# Patient Record
Sex: Male | Born: 1970 | Race: White | Hispanic: No | Marital: Married | Smoking: Former smoker
Health system: Southern US, Community
[De-identification: ages and names within clinical notes are randomized; demographics above are authoritative.]

## PROBLEM LIST (undated history)

## (undated) DIAGNOSIS — N200 Calculus of kidney: Secondary | ICD-10-CM

## (undated) HISTORY — PX: HEMORRHOID SURGERY: SHX153

## (undated) HISTORY — PX: NASAL SEPTUM SURGERY: SHX37

## (undated) HISTORY — PX: SHOULDER ARTHROSCOPY: SHX128

---

## 2012-03-12 ENCOUNTER — Encounter (HOSPITAL_COMMUNITY): Payer: Self-pay | Admitting: *Deleted

## 2012-03-12 ENCOUNTER — Emergency Department (HOSPITAL_COMMUNITY)
Admission: EM | Admit: 2012-03-12 | Discharge: 2012-03-13 | Disposition: A | Discharge status: Home / Self Care | Payer: BC Managed Care – PPO | Attending: Emergency Medicine | Admitting: Emergency Medicine

## 2012-03-12 DIAGNOSIS — N509 Disorder of male genital organs, unspecified: Secondary | ICD-10-CM | POA: Insufficient documentation

## 2012-03-12 DIAGNOSIS — R319 Hematuria, unspecified: Secondary | ICD-10-CM | POA: Insufficient documentation

## 2012-03-12 DIAGNOSIS — N200 Calculus of kidney: Secondary | ICD-10-CM | POA: Insufficient documentation

## 2012-03-12 DIAGNOSIS — F17200 Nicotine dependence, unspecified, uncomplicated: Secondary | ICD-10-CM | POA: Insufficient documentation

## 2012-03-12 DIAGNOSIS — R350 Frequency of micturition: Secondary | ICD-10-CM | POA: Insufficient documentation

## 2012-03-12 DIAGNOSIS — R109 Unspecified abdominal pain: Secondary | ICD-10-CM | POA: Insufficient documentation

## 2012-03-12 DIAGNOSIS — M549 Dorsalgia, unspecified: Secondary | ICD-10-CM | POA: Insufficient documentation

## 2012-03-12 NOTE — ED Notes (Signed)
Pt reports onset of left sided flank pain approx 3 hs ago, reports asso hematuria

## 2012-03-12 NOTE — ED Provider Notes (Signed)
History     CSN: [id number]  Arrival date & time 03/12/12  2158   First MD Initiated Contact with Patient 03/12/12 2351      Chief Complaint  Patient presents with  . Flank Pain    (Consider location/radiation/quality/duration/timing/severity/associated sxs/prior treatment) HPI Comments: Acute onset of left-sided flank pain radiating to the (around 8 PM. Pain lasted for about one hour and resolved after patient urinated. He describes dark and painful urination. He still has some residual pain in his left groin and testicle. Denies any nausea, vomiting or fever. No further abdominal pain or flank pain. No history of kidney stones. No chest pain or shortness of breath.  The history is provided by the spouse and the patient.    History reviewed. No pertinent past medical history.  Past Surgical History  Procedure Date  . Shoulder arthroscopy   . Hemorrhoid surgery     No family history on file.  History  Substance Use Topics  . Smoking status: Current Some Day Smoker  . Smokeless tobacco: Not on file  . Alcohol Use: Yes      Review of Systems  Constitutional: Negative for fever, activity change and appetite change.  HENT: Negative for congestion and rhinorrhea.   Respiratory: Negative for cough, chest tightness and shortness of breath.   Cardiovascular: Negative for chest pain.  Gastrointestinal: Negative for nausea, vomiting and abdominal pain.  Genitourinary: Positive for frequency, hematuria, flank pain, difficulty urinating and testicular pain.  Musculoskeletal: Positive for back pain.  Skin: Negative for rash.  Neurological: Negative for weakness and headaches.    Allergies  Review of patient's allergies indicates no known allergies.  Home Medications   Current Outpatient Rx  Name Route Sig Dispense Refill  . ZOLOFT PO Oral Take by mouth.    Marland Kitchen HYDROCODONE-ACETAMINOPHEN 5-325 MG PO TABS Oral Take 2 tablets by mouth every 4 (four) hours as needed for pain.  10 tablet 0  . IBUPROFEN 800 MG PO TABS Oral Take 1 tablet (800 mg total) by mouth 3 (three) times daily. 21 tablet 0  . ONDANSETRON HCL 4 MG PO TABS Oral Take 1 tablet (4 mg total) by mouth every 6 (six) hours. 12 tablet 0    BP 149/74  Pulse 87  Temp(Src) 98 F (36.7 C) (Oral)  Resp 20  Ht 6' (1.829 m)  Wt 210 lb (95.255 kg)  BMI 28.48 kg/m2  SpO2 99%  Physical Exam  Constitutional: He is oriented to person, place, and time. He appears well-developed and well-nourished. No distress.  HENT:  Head: Normocephalic and atraumatic.  Mouth/Throat: Oropharynx is clear and moist. No oropharyngeal exudate.  Eyes: Conjunctivae are normal. Pupils are equal, round, and reactive to light.  Neck: Normal range of motion.  Cardiovascular: Normal rate, regular rhythm and normal heart sounds.   No murmur heard. Pulmonary/Chest: Breath sounds normal. No respiratory distress.  Abdominal: Soft. There is no tenderness. There is no rebound and no guarding.  Genitourinary:       No testicular tenderness  Musculoskeletal: Normal range of motion. He exhibits no edema and no tenderness.       No CVAT  Neurological: He is alert and oriented to person, place, and time. No cranial nerve deficit.  Skin: Skin is warm.    ED Course  Procedures (including critical care time)  Labs Reviewed  URINALYSIS, ROUTINE W REFLEX MICROSCOPIC - Abnormal; Notable for the following:    Hgb urine dipstick LARGE (*)    Ketones, ur  TRACE (*)    All other components within normal limits  POCT I-STAT, CHEM 8 - Abnormal; Notable for the following:    Glucose, Bld 117 (*)    All other components within normal limits  URINE MICROSCOPIC-ADD ON - Abnormal; Notable for the following:    Bacteria, UA FEW (*)    All other components within normal limits   Ct Abdomen Pelvis Wo Contrast  03/13/2012  *RADIOLOGY REPORT*  Clinical Data: Left flank pain for 1 day.  CT ABDOMEN AND PELVIS WITHOUT CONTRAST  Technique:  Multidetector  CT imaging of the abdomen and pelvis was performed following the standard protocol without intravenous contrast.  Comparison: None.  Findings: The visualized portion of the liver, spleen, pancreas, and adrenal glands appear unremarkable in noncontrast CT appearance.  The gallbladder appears contracted.  Food material noted in the stomach.  A left kidney upper pole nonobstructive collecting system calcification is present.  There is also mild left hydronephrosis and borderline hydroureter extending to a 2 mm left distal ureteral calculus shown on image 82 of series 2.  No right renal calculi noted.  Urinary bladder appears unremarkable.  Scattered pelvic phleboliths are present.  Small retroperitoneal lymph nodes are not pathologically enlarged by size criteria.  No pathologic retroperitoneal or porta hepatis adenopathy is identified.  No pathologic pelvic adenopathy is identified.  The appendix appears abnormally distended, with frothy stool like density proximally near the cecum on images 58 through 64 of series 4, and with a denser appearance in the appendiceal tip on image 67 of series 4.  The appendix measures up to 1.3 cm in diameter.  IMPRESSION:  1.  2 mm left distal ureteral calculus associated with minimal left hydronephrosis and borderline left hydroureter. 2.  There is also a nonobstructive left kidney upper pole calculus. 3.  Abnormal appearance of the appendix, which appears distended and which has a dense distal appearance.  I cannot confidently exclude entities such as appendiceal carcinoid or mucinous tumor, and non-urgent resection of the appendix may be indicated.  Original Report Authenticated By: Dellia Cloud, M.D.     1. Nephrolithiasis       MDM  Flank pain with urinary symptoms.  Now resolved.  Abdomen soft and benign.  UA, PO fluids, istat  Hematuria without infection. Small distal left ureteral calculus seen. Patient remains pain-free.  He is informed of his abnormal  appearing appendix and given surgery followup with Dr. Leticia Penna. No RLQ pain.  Followup with urology as needed and return to the ED for worsening symptoms.      Glynn Octave, MD 03/13/12 [phone]

## 2012-03-13 ENCOUNTER — Emergency Department (HOSPITAL_COMMUNITY): Payer: BC Managed Care – PPO

## 2012-03-13 LAB — POCT I-STAT, CHEM 8
BUN: 8 mg/dL (ref 6–23)
Calcium, Ion: 1.17 mmol/L (ref 1.12–1.32)
Chloride: 110 mEq/L (ref 96–112)
Creatinine, Ser: 0.8 mg/dL (ref 0.50–1.35)
Glucose, Bld: 117 mg/dL — ABNORMAL HIGH (ref 70–99)
HCT: 50 % (ref 39.0–52.0)
Hemoglobin: 17 g/dL (ref 13.0–17.0)
Potassium: 4.2 mEq/L (ref 3.5–5.1)
Sodium: 144 mEq/L (ref 135–145)
TCO2: 23 mmol/L (ref 0–100)

## 2012-03-13 LAB — URINALYSIS, ROUTINE W REFLEX MICROSCOPIC
Glucose, UA: NEGATIVE mg/dL
Protein, ur: NEGATIVE mg/dL

## 2012-03-13 LAB — URINE MICROSCOPIC-ADD ON

## 2012-03-13 MED ORDER — ONDANSETRON HCL 4 MG PO TABS: 4.0000 mg | ORAL_TABLET | Freq: Four times a day (QID) | ORAL | Status: AC

## 2012-03-13 MED ORDER — IBUPROFEN 800 MG PO TABS: 800.0000 mg | ORAL_TABLET | Freq: Three times a day (TID) | ORAL | Status: AC

## 2012-03-13 MED ORDER — HYDROCODONE-ACETAMINOPHEN 5-325 MG PO TABS: 2.0000 | ORAL_TABLET | ORAL | Status: AC | PRN

## 2012-03-13 NOTE — Discharge Instructions (Signed)
Kidney Stones You have a small kidney stone. You should be able to pass this on her own. Take pain and nausea medications as prescribed. Follow up with Dr. Caesar Bookman regarding your abnormal appendix.  Return to the ED if you develop new or worsening symptoms. Kidney stones (ureteral lithiasis) are deposits that form inside your kidneys. The intense pain is caused by the stone moving through the urinary tract. When the stone moves, the ureter goes into spasm around the stone. The stone is usually passed in the urine.  CAUSES   A disorder that makes certain neck glands produce too much parathyroid hormone (primary hyperparathyroidism).   A buildup of uric acid crystals.   Narrowing (stricture) of the ureter.   A kidney obstruction present at birth (congenital obstruction).   Previous surgery on the kidney or ureters.   Numerous kidney infections.  SYMPTOMS   Feeling sick to your stomach (nauseous).   Throwing up (vomiting).   Blood in the urine (hematuria).   Pain that usually spreads (radiates) to the groin.   Frequency or urgency of urination.  DIAGNOSIS   Taking a history and physical exam.   Blood or urine tests.   Computerized X-ray scan (CT scan).   Occasionally, an examination of the inside of the urinary bladder (cystoscopy) is performed.  TREATMENT   Observation.   Increasing your fluid intake.   Surgery may be needed if you have severe pain or persistent obstruction.  The size, location, and chemical composition are all important variables that will determine the proper choice of action for you. Talk to your caregiver to better understand your situation so that you will minimize the risk of injury to yourself and your kidney.  HOME CARE INSTRUCTIONS   Drink enough water and fluids to keep your urine clear or pale yellow.   Strain all urine through the provided strainer. Keep all particulate matter and stones for your caregiver to see. The stone causing the pain  may be as small as a grain of salt. It is very important to use the strainer each and every time you pass your urine. The collection of your stone will allow your caregiver to analyze it and verify that a stone has actually passed.   Only take over-the-counter or prescription medicines for pain, discomfort, or fever as directed by your caregiver.   Make a follow-up appointment with your caregiver as directed.   Get follow-up X-rays if required. The absence of pain does not always mean that the stone has passed. It may have only stopped moving. If the urine remains completely obstructed, it can cause loss of kidney function or even complete destruction of the kidney. It is your responsibility to make sure X-rays and follow-ups are completed. Ultrasounds of the kidney can show blockages and the status of the kidney. Ultrasounds are not associated with any radiation and can be performed easily in a matter of minutes.  SEEK IMMEDIATE MEDICAL CARE IF:   Pain cannot be controlled with the prescribed medicine.   You have a fever.   The severity or intensity of pain increases over 18 hours and is not relieved by pain medicine.   You develop a new onset of abdominal pain.   You feel faint or pass out.  MAKE SURE YOU:   Understand these instructions.   Will watch your condition.   Will get help right away if you are not doing well or get worse.  Document Released: 10/18/2005 Document Revised: 10/07/2011 Document Reviewed: 02/13/2010  ExitCare Patient Information 2012 [place].

## 2015-02-04 ENCOUNTER — Inpatient Hospital Stay (HOSPITAL_COMMUNITY): Payer: BLUE CROSS/BLUE SHIELD | Admitting: Anesthesiology

## 2015-02-04 ENCOUNTER — Inpatient Hospital Stay (HOSPITAL_COMMUNITY)
Admission: EM | Admit: 2015-02-04 | Discharge: 2015-02-05 | DRG: 343 | Disposition: A | Discharge status: Home / Self Care | Payer: BLUE CROSS/BLUE SHIELD | Attending: General Surgery | Admitting: General Surgery

## 2015-02-04 ENCOUNTER — Emergency Department (HOSPITAL_COMMUNITY): Payer: BLUE CROSS/BLUE SHIELD

## 2015-02-04 ENCOUNTER — Encounter (HOSPITAL_COMMUNITY): Payer: Self-pay | Admitting: *Deleted

## 2015-02-04 ENCOUNTER — Encounter (HOSPITAL_COMMUNITY)
Admission: EM | Disposition: A | Discharge status: Home / Self Care | Payer: Self-pay | Source: Home / Self Care | Attending: General Surgery

## 2015-02-04 DIAGNOSIS — Z87891 Personal history of nicotine dependence: Secondary | ICD-10-CM

## 2015-02-04 DIAGNOSIS — K37 Unspecified appendicitis: Secondary | ICD-10-CM | POA: Diagnosis present

## 2015-02-04 DIAGNOSIS — Z87442 Personal history of urinary calculi: Secondary | ICD-10-CM | POA: Diagnosis not present

## 2015-02-04 DIAGNOSIS — Z7982 Long term (current) use of aspirin: Secondary | ICD-10-CM

## 2015-02-04 DIAGNOSIS — R1031 Right lower quadrant pain: Secondary | ICD-10-CM | POA: Diagnosis present

## 2015-02-04 DIAGNOSIS — K3580 Unspecified acute appendicitis: Secondary | ICD-10-CM | POA: Diagnosis present

## 2015-02-04 HISTORY — DX: Calculus of kidney: N20.0

## 2015-02-04 HISTORY — PX: LAPAROSCOPIC APPENDECTOMY: SHX408

## 2015-02-04 LAB — URINALYSIS, ROUTINE W REFLEX MICROSCOPIC
BILIRUBIN URINE: NEGATIVE
GLUCOSE, UA: NEGATIVE mg/dL
HGB URINE DIPSTICK: NEGATIVE
KETONES UR: NEGATIVE mg/dL
Leukocytes, UA: NEGATIVE
Nitrite: NEGATIVE
PROTEIN: NEGATIVE mg/dL
Specific Gravity, Urine: 1.02 (ref 1.005–1.030)
Urobilinogen, UA: 0.2 mg/dL (ref 0.0–1.0)
pH: 8 (ref 5.0–8.0)

## 2015-02-04 LAB — CBC WITH DIFFERENTIAL/PLATELET
BASOS ABS: 0 10*3/uL (ref 0.0–0.1)
BASOS PCT: 0 % (ref 0–1)
EOS ABS: 0 10*3/uL (ref 0.0–0.7)
EOS PCT: 0 % (ref 0–5)
HCT: 44.6 % (ref 39.0–52.0)
Hemoglobin: 15.4 g/dL (ref 13.0–17.0)
Lymphocytes Relative: 8 % — ABNORMAL LOW (ref 12–46)
Lymphs Abs: 0.9 10*3/uL (ref 0.7–4.0)
MCH: 30.6 pg (ref 26.0–34.0)
MCHC: 34.5 g/dL (ref 30.0–36.0)
MCV: 88.5 fL (ref 78.0–100.0)
MONO ABS: 1.2 10*3/uL — AB (ref 0.1–1.0)
Monocytes Relative: 11 % (ref 3–12)
Neutro Abs: 9.2 10*3/uL — ABNORMAL HIGH (ref 1.7–7.7)
Neutrophils Relative %: 81 % — ABNORMAL HIGH (ref 43–77)
PLATELETS: 290 10*3/uL (ref 150–400)
RBC: 5.04 MIL/uL (ref 4.22–5.81)
RDW: 12.7 % (ref 11.5–15.5)
WBC: 11.4 10*3/uL — AB (ref 4.0–10.5)

## 2015-02-04 LAB — SURGICAL PCR SCREEN
MRSA, PCR: NEGATIVE
STAPHYLOCOCCUS AUREUS: NEGATIVE

## 2015-02-04 LAB — COMPREHENSIVE METABOLIC PANEL
ALK PHOS: 110 U/L (ref 39–117)
ALT: 57 U/L — AB (ref 0–53)
AST: 38 U/L — AB (ref 0–37)
Albumin: 3.8 g/dL (ref 3.5–5.2)
Anion gap: 7 (ref 5–15)
BUN: 9 mg/dL (ref 6–23)
CO2: 24 mmol/L (ref 19–32)
Calcium: 8.7 mg/dL (ref 8.4–10.5)
Chloride: 107 mmol/L (ref 96–112)
Creatinine, Ser: 0.63 mg/dL (ref 0.50–1.35)
GLUCOSE: 103 mg/dL — AB (ref 70–99)
POTASSIUM: 3.6 mmol/L (ref 3.5–5.1)
Sodium: 138 mmol/L (ref 135–145)
Total Bilirubin: 0.6 mg/dL (ref 0.3–1.2)
Total Protein: 6.8 g/dL (ref 6.0–8.3)

## 2015-02-04 SURGERY — APPENDECTOMY, LAPAROSCOPIC
Anesthesia: General | Site: Abdomen

## 2015-02-04 MED ORDER — IOHEXOL 300 MG/ML  SOLN
50.0000 mL | Freq: Once | INTRAMUSCULAR | Status: AC | PRN
  Administered 2015-02-04: 50 mL via ORAL

## 2015-02-04 MED ORDER — GLYCOPYRROLATE 0.2 MG/ML IJ SOLN
INTRAMUSCULAR | Status: AC
  Filled 2015-02-04: qty 2

## 2015-02-04 MED ORDER — CHLORHEXIDINE GLUCONATE 4 % EX LIQD
1.0000 "application " | Freq: Once | CUTANEOUS | Status: DC
  Filled 2015-02-04: qty 15

## 2015-02-04 MED ORDER — IOHEXOL 300 MG/ML  SOLN
100.0000 mL | Freq: Once | INTRAMUSCULAR | Status: AC | PRN
  Administered 2015-02-04: 100 mL via INTRAVENOUS

## 2015-02-04 MED ORDER — POVIDONE-IODINE 10 % EX OINT
TOPICAL_OINTMENT | CUTANEOUS | Status: DC | PRN
  Administered 2015-02-04: 1 via TOPICAL

## 2015-02-04 MED ORDER — PROPOFOL 10 MG/ML IV BOLUS
INTRAVENOUS | Status: AC
  Filled 2015-02-04: qty 20

## 2015-02-04 MED ORDER — FENTANYL CITRATE 0.05 MG/ML IJ SOLN
INTRAMUSCULAR | Status: DC | PRN
  Administered 2015-02-04: 100 ug via INTRAVENOUS
  Administered 2015-02-04 (×3): 50 ug via INTRAVENOUS

## 2015-02-04 MED ORDER — HYDROMORPHONE HCL 1 MG/ML IJ SOLN
1.0000 mg | INTRAMUSCULAR | Status: DC | PRN
  Administered 2015-02-04 – 2015-02-05 (×3): 1 mg via INTRAVENOUS
  Filled 2015-02-04 (×4): qty 1

## 2015-02-04 MED ORDER — ONDANSETRON HCL 4 MG/2ML IJ SOLN: 4.0000 mg | Freq: Four times a day (QID) | INTRAMUSCULAR | Status: DC | PRN

## 2015-02-04 MED ORDER — PIPERACILLIN-TAZOBACTAM 3.375 G IVPB
3.3750 g | Freq: Three times a day (TID) | INTRAVENOUS | Status: DC
  Administered 2015-02-05 (×2): 3.375 g via INTRAVENOUS
  Filled 2015-02-04 (×5): qty 50

## 2015-02-04 MED ORDER — HYDROMORPHONE HCL 1 MG/ML IJ SOLN
1.0000 mg | INTRAMUSCULAR | Status: DC | PRN
  Administered 2015-02-04: 1 mg via INTRAVENOUS
  Filled 2015-02-04: qty 1

## 2015-02-04 MED ORDER — BUPIVACAINE HCL (PF) 0.5 % IJ SOLN
INTRAMUSCULAR | Status: AC
  Filled 2015-02-04: qty 30

## 2015-02-04 MED ORDER — 0.9 % SODIUM CHLORIDE (POUR BTL) OPTIME
TOPICAL | Status: DC | PRN
  Administered 2015-02-04: 1000 mL

## 2015-02-04 MED ORDER — LACTATED RINGERS IV SOLN
INTRAVENOUS | Status: DC
  Administered 2015-02-04 (×2): via INTRAVENOUS

## 2015-02-04 MED ORDER — KETOROLAC TROMETHAMINE 30 MG/ML IJ SOLN
30.0000 mg | Freq: Once | INTRAMUSCULAR | Status: AC
  Administered 2015-02-04: 30 mg via INTRAVENOUS

## 2015-02-04 MED ORDER — ONDANSETRON HCL 4 MG PO TABS: 4.0000 mg | ORAL_TABLET | Freq: Four times a day (QID) | ORAL | Status: DC | PRN

## 2015-02-04 MED ORDER — MIDAZOLAM HCL 2 MG/2ML IJ SOLN
INTRAMUSCULAR | Status: AC
  Filled 2015-02-04: qty 2

## 2015-02-04 MED ORDER — LACTATED RINGERS IV SOLN: INTRAVENOUS | Status: DC

## 2015-02-04 MED ORDER — KETOROLAC TROMETHAMINE 30 MG/ML IJ SOLN
INTRAMUSCULAR | Status: AC
  Filled 2015-02-04: qty 1

## 2015-02-04 MED ORDER — NEOSTIGMINE METHYLSULFATE 10 MG/10ML IV SOLN
INTRAVENOUS | Status: AC
  Filled 2015-02-04: qty 1

## 2015-02-04 MED ORDER — BUPIVACAINE HCL (PF) 0.5 % IJ SOLN
INTRAMUSCULAR | Status: DC | PRN
  Administered 2015-02-04: 10 mL

## 2015-02-04 MED ORDER — HYDROMORPHONE HCL 1 MG/ML IJ SOLN
1.0000 mg | Freq: Once | INTRAMUSCULAR | Status: AC
  Administered 2015-02-04: 1 mg via INTRAVENOUS
  Filled 2015-02-04: qty 1

## 2015-02-04 MED ORDER — ROCURONIUM BROMIDE 100 MG/10ML IV SOLN
INTRAVENOUS | Status: DC | PRN
  Administered 2015-02-04: 30 mg via INTRAVENOUS
  Administered 2015-02-04: 10 mg via INTRAVENOUS

## 2015-02-04 MED ORDER — ROCURONIUM BROMIDE 50 MG/5ML IV SOLN
INTRAVENOUS | Status: AC
  Filled 2015-02-04: qty 1

## 2015-02-04 MED ORDER — ONDANSETRON HCL 4 MG/2ML IJ SOLN
INTRAMUSCULAR | Status: AC
  Filled 2015-02-04: qty 2

## 2015-02-04 MED ORDER — LIDOCAINE HCL 1 % IJ SOLN
INTRAMUSCULAR | Status: DC | PRN
  Administered 2015-02-04: 30 mg via INTRADERMAL

## 2015-02-04 MED ORDER — GLYCOPYRROLATE 0.2 MG/ML IJ SOLN
INTRAMUSCULAR | Status: DC | PRN
  Administered 2015-02-04: 0.4 mg via INTRAVENOUS

## 2015-02-04 MED ORDER — MIDAZOLAM HCL 5 MG/5ML IJ SOLN
INTRAMUSCULAR | Status: DC | PRN
  Administered 2015-02-04 (×2): 2 mg via INTRAVENOUS

## 2015-02-04 MED ORDER — OXYCODONE-ACETAMINOPHEN 5-325 MG PO TABS
1.0000 | ORAL_TABLET | ORAL | Status: DC | PRN
  Administered 2015-02-05 (×2): 2 via ORAL
  Filled 2015-02-04 (×2): qty 2

## 2015-02-04 MED ORDER — NEOSTIGMINE METHYLSULFATE 10 MG/10ML IV SOLN
INTRAVENOUS | Status: DC | PRN
  Administered 2015-02-04: 2 mg via INTRAVENOUS
  Administered 2015-02-04: 1 mg via INTRAVENOUS

## 2015-02-04 MED ORDER — LORAZEPAM 2 MG/ML IJ SOLN
1.0000 mg | INTRAMUSCULAR | Status: DC | PRN
  Administered 2015-02-04: 1 mg via INTRAVENOUS
  Filled 2015-02-04: qty 1

## 2015-02-04 MED ORDER — ONDANSETRON HCL 4 MG/2ML IJ SOLN
INTRAMUSCULAR | Status: DC | PRN
Start: 2015-02-04 — End: 2015-02-04
  Administered 2015-02-04: 4 mg via INTRAVENOUS

## 2015-02-04 MED ORDER — ENOXAPARIN SODIUM 40 MG/0.4ML ~~LOC~~ SOLN: 40.0000 mg | SUBCUTANEOUS | Status: DC

## 2015-02-04 MED ORDER — PIPERACILLIN-TAZOBACTAM 3.375 G IVPB
3.3750 g | Freq: Three times a day (TID) | INTRAVENOUS | Status: DC
  Administered 2015-02-04: 3.375 g via INTRAVENOUS
  Filled 2015-02-04 (×4): qty 50

## 2015-02-04 MED ORDER — PROPOFOL 10 MG/ML IV BOLUS
INTRAVENOUS | Status: DC | PRN
  Administered 2015-02-04: 140 mg via INTRAVENOUS

## 2015-02-04 MED ORDER — FENTANYL CITRATE 0.05 MG/ML IJ SOLN
INTRAMUSCULAR | Status: AC
Start: 2015-02-04 — End: 2015-02-04
  Filled 2015-02-04: qty 5

## 2015-02-04 MED ORDER — POVIDONE-IODINE 10 % EX OINT
TOPICAL_OINTMENT | CUTANEOUS | Status: AC
  Filled 2015-02-04: qty 1

## 2015-02-04 MED ORDER — SODIUM CHLORIDE 0.9 % IV SOLN
3.0000 g | Freq: Once | INTRAVENOUS | Status: AC
  Administered 2015-02-04: 3 g via INTRAVENOUS
  Filled 2015-02-04: qty 3

## 2015-02-04 MED ORDER — ENOXAPARIN SODIUM 40 MG/0.4ML ~~LOC~~ SOLN
40.0000 mg | SUBCUTANEOUS | Status: DC
  Administered 2015-02-04: 40 mg via SUBCUTANEOUS
  Filled 2015-02-04: qty 0.4

## 2015-02-04 MED ORDER — LIDOCAINE HCL (PF) 1 % IJ SOLN
INTRAMUSCULAR | Status: AC
  Filled 2015-02-04: qty 5

## 2015-02-04 SURGICAL SUPPLY — 49 items
BAG HAMPER (MISCELLANEOUS) ×3 IMPLANT
CHLORAPREP W/TINT 26ML (MISCELLANEOUS) ×3 IMPLANT
CLOTH BEACON ORANGE TIMEOUT ST (SAFETY) ×3 IMPLANT
COVER LIGHT HANDLE STERIS (MISCELLANEOUS) ×6 IMPLANT
CUTTER FLEX LINEAR 45M (STAPLE) ×3 IMPLANT
CUTTER LINEAR ENDO 35 ETS (STAPLE) IMPLANT
CUTTER LINEAR ENDO 35 ETS TH (STAPLE) IMPLANT
DECANTER SPIKE VIAL GLASS SM (MISCELLANEOUS) ×3 IMPLANT
DISSECTOR BLUNT TIP ENDO 5MM (MISCELLANEOUS) IMPLANT
ELECT REM PT RETURN 9FT ADLT (ELECTROSURGICAL) ×3
ELECTRODE REM PT RTRN 9FT ADLT (ELECTROSURGICAL) ×1 IMPLANT
FILTER SMOKE EVAC LAPAROSHD (FILTER) ×3 IMPLANT
FORMALIN 10 PREFIL 120ML (MISCELLANEOUS) ×3 IMPLANT
GLOVE BIO SURGEON STRL SZ7 (GLOVE) ×6 IMPLANT
GLOVE BIOGEL PI IND STRL 7.0 (GLOVE) ×2 IMPLANT
GLOVE BIOGEL PI INDICATOR 7.0 (GLOVE) ×4
GLOVE SURG SS PI 7.5 STRL IVOR (GLOVE) ×3 IMPLANT
GOWN STRL REUS W/ TWL XL LVL3 (GOWN DISPOSABLE) ×2 IMPLANT
GOWN STRL REUS W/TWL LRG LVL3 (GOWN DISPOSABLE) ×3 IMPLANT
GOWN STRL REUS W/TWL XL LVL3 (GOWN DISPOSABLE) ×4
INST SET LAPROSCOPIC AP (KITS) ×3 IMPLANT
IV NS IRRIG 3000ML ARTHROMATIC (IV SOLUTION) IMPLANT
KIT ROOM TURNOVER APOR (KITS) ×3 IMPLANT
MANIFOLD NEPTUNE II (INSTRUMENTS) ×3 IMPLANT
NEEDLE INSUFFLATION 14GA 120MM (NEEDLE) ×3 IMPLANT
NS IRRIG 1000ML POUR BTL (IV SOLUTION) ×3 IMPLANT
PACK LAP CHOLE LZT030E (CUSTOM PROCEDURE TRAY) ×3 IMPLANT
PAD ARMBOARD 7.5X6 YLW CONV (MISCELLANEOUS) ×3 IMPLANT
PENCIL HANDSWITCHING (ELECTRODE) ×3 IMPLANT
POUCH SPECIMEN RETRIEVAL 10MM (ENDOMECHANICALS) ×3 IMPLANT
RELOAD /EVU35 (ENDOMECHANICALS) IMPLANT
RELOAD 45 VASCULAR/THIN (ENDOMECHANICALS) IMPLANT
RELOAD CUTTER ETS 35MM STAND (ENDOMECHANICALS) IMPLANT
RELOAD STAPLE TA45 3.5 REG BLU (ENDOMECHANICALS) ×3 IMPLANT
SCALPEL HARMONIC ACE (MISCELLANEOUS) ×3 IMPLANT
SET BASIN LINEN APH (SET/KITS/TRAYS/PACK) ×3 IMPLANT
SET TUBE IRRIG SUCTION NO TIP (IRRIGATION / IRRIGATOR) IMPLANT
SPONGE GAUZE 2X2 8PLY STER LF (GAUZE/BANDAGES/DRESSINGS) ×3
SPONGE GAUZE 2X2 8PLY STRL LF (GAUZE/BANDAGES/DRESSINGS) ×6 IMPLANT
STAPLER VISISTAT (STAPLE) ×3 IMPLANT
SUT VICRYL 0 UR6 27IN ABS (SUTURE) ×3 IMPLANT
TAPE CLOTH SURG 4X10 WHT LF (GAUZE/BANDAGES/DRESSINGS) ×3 IMPLANT
TRAY FOLEY CATH 16FR SILVER (SET/KITS/TRAYS/PACK) ×3 IMPLANT
TROCAR ENDO BLADELESS 11MM (ENDOMECHANICALS) ×3 IMPLANT
TROCAR ENDO BLADELESS 12MM (ENDOMECHANICALS) ×3 IMPLANT
TROCAR XCEL NON-BLD 5MMX100MML (ENDOMECHANICALS) ×3 IMPLANT
TUBING INSUFFLATION (TUBING) ×3 IMPLANT
WARMER LAPAROSCOPE (MISCELLANEOUS) ×3 IMPLANT
YANKAUER SUCT 12FT TUBE ARGYLE (SUCTIONS) ×3 IMPLANT

## 2015-02-04 NOTE — Anesthesia Preprocedure Evaluation (Addendum)
Anesthesia Evaluation  Patient identified by MRN, date of birth, ID band Patient awake    Reviewed: Allergy & Precautions, NPO status , Patient's Chart, lab work & pertinent test results  Airway   TM Distance: >3 FB Neck ROM: Full    Dental  (+) Teeth Intact   Pulmonary former smoker,  breath sounds clear to auscultation        Cardiovascular Exercise Tolerance: Good hypertension, Rhythm:Regular Rate:Normal     Neuro/Psych Anxiety Depression    GI/Hepatic   Endo/Other  negative endocrine ROS  Renal/GU      Musculoskeletal   Abdominal (+) + obese,  Abdomen: soft. Bowel sounds: absent.  Peds  Hematology   Anesthesia Other Findings   Reproductive/Obstetrics                           Anesthesia Physical Anesthesia Plan  ASA: II and emergent  Anesthesia Plan: General   Post-op Pain Management:    Induction: Intravenous, Rapid sequence and Cricoid pressure planned  Airway Management Planned: Oral ETT  Additional Equipment:   Intra-op Plan:   Post-operative Plan: Extubation in OR  Informed Consent: I have reviewed the patients History and Physical, chart, labs and discussed the procedure including the risks, benefits and alternatives for the proposed anesthesia with the patient or authorized representative who has indicated his/her understanding and acceptance.     Plan Discussed with: CRNA and Anesthesiologist  Anesthesia Plan Comments:         Anesthesia Quick Evaluation

## 2015-02-04 NOTE — ED Notes (Signed)
RLQ pain for 2 days,no n/v . Has had diarrhea.

## 2015-02-04 NOTE — H&P (Signed)
Jared Vincent is an 44 y.o. male.   Chief Complaint: Right lower quadrant abdominal pain HPI: Patient is a 44 year old white male who presented emergency room with a several-day history of worsening lower abdominal pain. He has a history of a kidney stone, though he did say that this was different than his previous kidney stone attack. CT scan of the abdomen and pelvis reveals acute appendicitis. No perforation was seen.  Past Medical History  Diagnosis Date  . Kidney stone     Past Surgical History  Procedure Laterality Date  . Shoulder arthroscopy    . Hemorrhoid surgery    . Nasal septum surgery      History reviewed. No pertinent family history. Social History:  reports that he has quit smoking. He does not have any smokeless tobacco history on file. He reports that he does not drink alcohol or use illicit drugs.  Allergies: No Known Allergies  Medications Prior to Admission  Medication Sig Dispense Refill  . aspirin EC 81 MG tablet Take 81 mg by mouth daily.    . montelukast (SINGULAIR) 10 MG tablet Take 10 mg by mouth at bedtime.    . Multiple Vitamin (MULTIVITAMIN WITH MINERALS) TABS tablet Take 1 tablet by mouth daily.    . Sertraline HCl (ZOLOFT PO) Take 100 mg by mouth daily.     Marland Kitchen zolpidem (AMBIEN) 10 MG tablet Take 10 mg by mouth at bedtime as needed for sleep.      Results for orders placed or performed during the hospital encounter of 02/04/15 (from the past 48 hour(s))  CBC with Differential     Status: Abnormal   Collection Time: 02/04/15 11:50 AM  Result Value Ref Range   WBC 11.4 (H) 4.0 - 10.5 K/uL   RBC 5.04 4.22 - 5.81 MIL/uL   Hemoglobin 15.4 13.0 - 17.0 g/dL   HCT 44.6 39.0 - 52.0 %   MCV 88.5 78.0 - 100.0 fL   MCH 30.6 26.0 - 34.0 pg   MCHC 34.5 30.0 - 36.0 g/dL   RDW 12.7 11.5 - 15.5 %   Platelets 290 150 - 400 K/uL   Neutrophils Relative % 81 (H) 43 - 77 %   Neutro Abs 9.2 (H) 1.7 - 7.7 K/uL   Lymphocytes Relative 8 (L) 12 - 46 %   Lymphs Abs  0.9 0.7 - 4.0 K/uL   Monocytes Relative 11 3 - 12 %   Monocytes Absolute 1.2 (H) 0.1 - 1.0 K/uL   Eosinophils Relative 0 0 - 5 %   Eosinophils Absolute 0.0 0.0 - 0.7 K/uL   Basophils Relative 0 0 - 1 %   Basophils Absolute 0.0 0.0 - 0.1 K/uL  Comprehensive metabolic panel     Status: Abnormal   Collection Time: 02/04/15 11:50 AM  Result Value Ref Range   Sodium 138 135 - 145 mmol/L   Potassium 3.6 3.5 - 5.1 mmol/L   Chloride 107 96 - 112 mmol/L   CO2 24 19 - 32 mmol/L   Glucose, Bld 103 (H) 70 - 99 mg/dL   BUN 9 6 - 23 mg/dL   Creatinine, Ser 0.63 0.50 - 1.35 mg/dL   Calcium 8.7 8.4 - 10.5 mg/dL   Total Protein 6.8 6.0 - 8.3 g/dL   Albumin 3.8 3.5 - 5.2 g/dL   AST 38 (H) 0 - 37 U/L   ALT 57 (H) 0 - 53 U/L   Alkaline Phosphatase 110 39 - 117 U/L   Total Bilirubin 0.6  0.3 - 1.2 mg/dL   GFR calc non Af Amer >90 >90 mL/min   GFR calc Af Amer >90 >90 mL/min    Comment: (NOTE) The eGFR has been calculated using the CKD EPI equation. This calculation has not been validated in all clinical situations. eGFR's persistently <90 mL/min signify possible Chronic Kidney Disease.    Anion gap 7 5 - 15  Urinalysis, Routine w reflex microscopic     Status: None   Collection Time: 02/04/15 11:59 AM  Result Value Ref Range   Color, Urine YELLOW YELLOW   APPearance CLEAR CLEAR   Specific Gravity, Urine 1.020 1.005 - 1.030   pH 8.0 5.0 - 8.0   Glucose, UA NEGATIVE NEGATIVE mg/dL   Hgb urine dipstick NEGATIVE NEGATIVE   Bilirubin Urine NEGATIVE NEGATIVE   Ketones, ur NEGATIVE NEGATIVE mg/dL   Protein, ur NEGATIVE NEGATIVE mg/dL   Urobilinogen, UA 0.2 0.0 - 1.0 mg/dL   Nitrite NEGATIVE NEGATIVE   Leukocytes, UA NEGATIVE NEGATIVE    Comment: MICROSCOPIC NOT DONE ON URINES WITH NEGATIVE PROTEIN, BLOOD, LEUKOCYTES, NITRITE, OR GLUCOSE <1000 mg/dL.   Ct Abdomen Pelvis W Contrast  02/04/2015   CLINICAL DATA:  Acute right lower quadrant pain for 3 days.  EXAM: CT ABDOMEN AND PELVIS WITH CONTRAST   TECHNIQUE: Multidetector CT imaging of the abdomen and pelvis was performed using the standard protocol following bolus administration of intravenous contrast.  CONTRAST:  64m OMNIPAQUE IOHEXOL 300 MG/ML SOLN, 1057mOMNIPAQUE IOHEXOL 300 MG/ML SOLN  COMPARISON:  CT scan of Mar 13, 2012.  FINDINGS: Mild degenerative disc disease is noted at L4-5. Visualized lung bases appear normal.  No gallstones are noted. The liver, spleen and pancreas appear normal. Adrenal glands and kidneys appear normal. No hydronephrosis or renal obstruction is noted. Small nonobstructive calculus is noted in upper pole collecting system of left kidney. No ureteral calculi are noted. The appendix is enlarged with surrounding inflammation consistent with appendicitis. No abnormal fluid collection is noted. There is no evidence of bowel obstruction. Urinary bladder appears normal. No significant adenopathy is noted.  IMPRESSION: Findings consistent with acute appendicitis.   Electronically Signed   By: JaMarijo ConceptionM.D.   On: 02/04/2015 14:02    Review of Systems  Constitutional: Positive for malaise/fatigue.  HENT: Negative.   Eyes: Negative.   Respiratory: Negative.   Cardiovascular: Negative.   Gastrointestinal: Positive for abdominal pain.  Genitourinary: Negative.   Musculoskeletal: Negative.   Skin: Negative.     Blood pressure 153/88, pulse 79, temperature 98.6 F (37 C), temperature source Oral, resp. rate 18, height 6' (1.829 m), weight 104.826 kg (231 lb 1.6 oz), SpO2 99 %. Physical Exam  Constitutional: He is oriented to person, place, and time. He appears well-developed and well-nourished.  HENT:  Head: Normocephalic and atraumatic.  Neck: Normal range of motion. Neck supple.  Cardiovascular: Normal rate, regular rhythm and normal heart sounds.   Respiratory: Effort normal and breath sounds normal.  GI: Soft. He exhibits no distension. There is tenderness. There is no rebound and no guarding.  Tender in  the right lower quadrant to palpation. No rigidity noted.  Neurological: He is alert and oriented to person, place, and time.  Skin: Skin is warm and dry.     Assessment/Plan Impression: Acute appendicitis Plan: Patient be taken to the operating room for laparoscopic appendectomy. The risks and benefits of the procedure including bleeding, infection, and the possibility of an open procedure were fully explained to the  patient, who gave informed consent.  Shamona Wirtz A 02/04/2015, 6:56 PM

## 2015-02-04 NOTE — Anesthesia Postprocedure Evaluation (Signed)
  Anesthesia Post-op Note  Patient: Jared MireKevin Vincent  Procedure(s) Performed: Procedure(s): APPENDECTOMY LAPAROSCOPIC (N/A)  Patient Location: PACU  Anesthesia Type:General  Level of Consciousness: awake, alert , oriented and patient cooperative  Airway and Oxygen Therapy: Patient Spontanous Breathing  Post-op Pain: 3 /10, mild  Post-op Assessment: Post-op Vital signs reviewed, Patient's Cardiovascular Status Stable, Respiratory Function Stable, Patent Airway, Pain level controlled and No headache  Post-op Vital Signs: Reviewed and stable  Last Vitals:  Filed Vitals:   02/04/15 2100  BP: 114/79  Pulse: 64  Temp:   Resp: 18    Complications: No apparent anesthesia complications

## 2015-02-04 NOTE — ED Notes (Signed)
Awaiting consult from general surgery.

## 2015-02-04 NOTE — Transfer of Care (Signed)
Immediate Anesthesia Transfer of Care Note  Patient: Jared Vincent  Procedure(s) Performed: Procedure(s): APPENDECTOMY LAPAROSCOPIC (N/A)  Patient Location: PACU  Anesthesia Type:General  Level of Consciousness: awake, alert , oriented and patient cooperative  Airway & Oxygen Therapy: Patient Spontanous Breathing and Patient connected to face mask oxygen  Post-op Assessment: Report given to RN, Post -op Vital signs reviewed and stable and Patient moving all extremities  Post vital signs: Reviewed and stable  Last Vitals:  Filed Vitals:   02/04/15 1724  BP: 153/88  Pulse: 79  Temp: 37 C  Resp: 18    Complications: No apparent anesthesia complications

## 2015-02-04 NOTE — ED Provider Notes (Signed)
CSN: [id number]     Arrival date & time 02/04/15  1117 History  This chart was scribed for Eber Hong, MD by Tonye Royalty, ED Scribe. This patient was seen in room [facility]01/[facility]01 and the patient's care was started at 11:42 AM.    Chief Complaint  Patient presents with  . Abdominal Pain   The history is provided by the patient. No language interpreter was used.    HPI Comments: Jared Vincent is a 44 y.o. male with history of kidney stones who presents to the Emergency Department complaining of constant RLQ pain with gradual onset 2 days ago, gradually worsening and severe now. He denies lifting anything heavy or other incident. He reports associated diarrhea which he describes as looking like water, with 5 counts this morning. He reports some testicular pain last night. He states this feels unlike prior kidney stones; he notes imaging revealed "abnodmal appendix" during kidney stone workup which he did not follow up on. He denies prior abdominal surgeries. He states he has been eating and drinking normally, though he has not eaten his morning. He denies urinary symptoms, dysuria, vomiting, leg swelling, back pain,   Past Medical History  Diagnosis Date  . Kidney stone    Past Surgical History  Procedure Laterality Date  . Shoulder arthroscopy    . Hemorrhoid surgery    . Nasal septum surgery     History reviewed. No pertinent family history. History  Substance Use Topics  . Smoking status: Former Games developer  . Smokeless tobacco: Not on file  . Alcohol Use: No    Review of Systems  Constitutional: Negative for appetite change.  Cardiovascular: Negative for leg swelling.  Gastrointestinal: Positive for abdominal pain and diarrhea. Negative for vomiting.  Genitourinary: Positive for testicular pain. Negative for dysuria, hematuria and difficulty urinating.  Musculoskeletal: Negative for back pain.  All other systems reviewed and are negative.     Allergies  Review of patient's  allergies indicates no known allergies.  Home Medications   Prior to Admission medications   Medication Sig Start Date End Date Taking? Authorizing Provider  Sertraline HCl (ZOLOFT PO) Take by mouth.    Historical Provider, MD   Pulse 83  Temp(Src) 98.3 F (36.8 C) (Oral)  Resp 18  Ht 6' (1.829 m)  Wt 237 lb (107.502 kg)  BMI 32.14 kg/m2  SpO2 98% Physical Exam  Constitutional: He appears well-developed and well-nourished. No distress.  HENT:  Head: Normocephalic and atraumatic.  Mouth/Throat: Oropharynx is clear and moist. No oropharyngeal exudate.  Eyes: Conjunctivae and EOM are normal. Pupils are equal, round, and reactive to light. Right eye exhibits no discharge. Left eye exhibits no discharge. No scleral icterus.  Neck: Normal range of motion. Neck supple. No JVD present. No thyromegaly present.  Cardiovascular: Normal rate, regular rhythm, normal heart sounds and intact distal pulses.  Exam reveals no gallop and no friction rub.   No murmur heard. Pulmonary/Chest: Effort normal and breath sounds normal. No respiratory distress. He has no wheezes. He has no rales.  Abdominal: Soft. Bowel sounds are normal. He exhibits no distension and no mass. There is tenderness.  Pain at [place];s point Mild guarding No Rovsing sign  Musculoskeletal: Normal range of motion. He exhibits no edema or tenderness.  Lymphadenopathy:    He has no cervical adenopathy.  Neurological: He is alert. Coordination normal.  Skin: Skin is warm and dry. No rash noted. No erythema.  Psychiatric: He has a normal mood and affect. His behavior  is normal.  Nursing note and vitals reviewed.   ED Course  Procedures (including critical care time)  DIAGNOSTIC STUDIES: Oxygen Saturation is 98% on room air, normal by my interpretation.    COORDINATION OF CARE: 11:44 AM Discussed treatment plan with patient at beside, the patient agrees with the plan and has no further questions at this time.   Labs  Review Labs Reviewed - No data to display  Imaging Review No results found.    MDM   Final diagnoses:  None    CT shows acute app  Labs with mild leukocytosis Renal function normal VSnormal NPO Abx, Pain control  D/w Dr. Lovell SheehanJenkins - will admit.  Meds given in ED:  Medications  Ampicillin-Sulbactam (UNASYN) 3 g in sodium chloride 0.9 % 100 mL IVPB (3 g Intravenous New Bag/Given 02/04/15 1413)  HYDROmorphone (DILAUDID) injection 1 mg (1 mg Intravenous Given 02/04/15 1203)  iohexol (OMNIPAQUE) 300 MG/ML solution 50 mL (50 mLs Oral Contrast Given 02/04/15 1202)  iohexol (OMNIPAQUE) 300 MG/ML solution 100 mL (100 mLs Intravenous Contrast Given 02/04/15 1312)  HYDROmorphone (DILAUDID) injection 1 mg (1 mg Intravenous Given 02/04/15 1411)     I personally performed the services described in this documentation, which was scribed in my presence. The recorded information has been reviewed and is accurate.      Eber HongBrian Conita Amenta, MD 02/04/15 1425

## 2015-02-04 NOTE — Op Note (Signed)
Patient:  Jared Vincent  DOB:  06-10-1971  MRN:  [id number]   Preop Diagnosis:  Acute appendicitis  Postop Diagnosis:  Same  Procedure:  Laparoscopic appendectomy  Surgeon:  Franky MachoMark Ijanae Macapagal, M.D.  Anes:  Gen. endotracheal  Indications:  Patient is a 44 year old white male who presents with a several-day history of worsening right lower quadrant abdominal pain. CT scan the abdomen revealed acute appendicitis. The patient now comes to the operating room for laparoscopic appendectomy. The risks and benefits of the procedure including bleeding, infection, and the possibility of an open procedure were fully explained to the patient, who gave informed consent.  Procedure note:  Patient is placed the supine position. After induction of general endotracheal anesthesia, the abdomen was prepped and draped using usual sterile technique with ChloraPrep. Surgical site confirmation was performed.  A supraumbilical incision was made down to the fascia. A Veress needle was introduced into the abdominal cavity and confirmation of placement was done using the saline drop test. The abdomen was then insufflated to extreme millimeters mercury pressure. 11 mm trocar was introduced into the abdominal cavity under direct visualization without difficulty. The patient was placed in deeper Trendelenburg position and a 12 mm trocar was placed the suprapubic region and a 5 mm trocar was placed left lower quadrant region. The appendix was visualized and noted to be adherent to the lateral abdominal wall. The appendix was freed away and the mesentery divided using the harmonic scalpel. A standard 45 cm Endo GIA was placed across the base the appendix and fired. The appendix was then removed using an Endo Catch catch bag without difficulty. The staple line was inspected and noted to be within normal limits. All fluid and air were then evacuated from the right lower quadrant prior to removal of the trochars.  All wounds were  irrigated with normal saline. All wounds were checked with 0.5% Sensorcaine. The supraumbilical fascia was reapproximated using an 0 Vicryl interrupted suture. All skin incisions were closed using staples. Betadine ointment and dry sterile dressings were applied.  All tape and needle counts were correct the end of the procedure. The patient was extubated in the operating room and transferred to PACU in stable condition.  Complications:  None  EBL:  Minimal  Specimen:  Appendix

## 2015-02-05 LAB — CBC
HCT: 41.5 % (ref 39.0–52.0)
HEMOGLOBIN: 13.9 g/dL (ref 13.0–17.0)
MCH: 30.7 pg (ref 26.0–34.0)
MCHC: 33.5 g/dL (ref 30.0–36.0)
MCV: 91.6 fL (ref 78.0–100.0)
PLATELETS: 263 10*3/uL (ref 150–400)
RBC: 4.53 MIL/uL (ref 4.22–5.81)
RDW: 13.1 % (ref 11.5–15.5)
WBC: 8.5 10*3/uL (ref 4.0–10.5)

## 2015-02-05 LAB — BASIC METABOLIC PANEL
Anion gap: 7 (ref 5–15)
BUN: 9 mg/dL (ref 6–23)
CHLORIDE: 103 mmol/L (ref 96–112)
CO2: 29 mmol/L (ref 19–32)
Calcium: 8.2 mg/dL — ABNORMAL LOW (ref 8.4–10.5)
Creatinine, Ser: 0.71 mg/dL (ref 0.50–1.35)
GFR calc Af Amer: >90 mL/min (ref >90)
GFR calc non Af Amer: >90 mL/min (ref >90)
GLUCOSE: 113 mg/dL — AB (ref 70–99)
POTASSIUM: 3.6 mmol/L (ref 3.5–5.1)
Sodium: 139 mmol/L (ref 135–145)

## 2015-02-05 MED ORDER — OXYCODONE-ACETAMINOPHEN 7.5-325 MG PO TABS: 1.0000 | ORAL_TABLET | ORAL | Status: AC | PRN

## 2015-02-05 NOTE — Care Management Utilization Note (Signed)
UR completed 

## 2015-02-05 NOTE — Anesthesia Postprocedure Evaluation (Signed)
  Anesthesia Post-op Note  Patient: Jared Vincent  Procedure(s) Performed: Procedure(s): APPENDECTOMY LAPAROSCOPIC (N/A)  Patient Location: room 308  Anesthesia Type:General  Level of Consciousness: awake, alert , oriented and patient cooperative  Airway and Oxygen Therapy: Patient Spontanous Breathing  Post-op Pain: 2 /10  Post-op Assessment: Post-op Vital signs reviewed, Patient's Cardiovascular Status Stable, Respiratory Function Stable, Patent Airway, No signs of Nausea or vomiting, Pain level controlled and No headache  Post-op Vital Signs: Reviewed and stable  Last Vitals:  Filed Vitals:   02/05/15 0459  BP: 131/81  Pulse: 71  Temp: 36.7 C  Resp: 20    Complications: No apparent anesthesia complications

## 2015-02-05 NOTE — Addendum Note (Signed)
Addendum  created 02/05/15 1052 by Despina Hiddenobert J Rulon Abdalla, CRNA   Modules edited: Notes Section   Notes Section:  File: [id number]

## 2015-02-05 NOTE — Anesthesia Procedure Notes (Signed)
Procedure Name: Intubation Date/Time: 02/04/2015 8:00 PM Performed by: Despina HiddenIDACAVAGE, Jeaninne Lodico J Pre-anesthesia Checklist: Emergency Drugs available, Patient identified, Suction available and Patient being monitored Patient Re-evaluated:Patient Re-evaluated prior to inductionOxygen Delivery Method: Circle system utilized Preoxygenation: Pre-oxygenation with 100% oxygen Intubation Type: IV induction Ventilation: Mask ventilation without difficulty and Oral airway inserted - appropriate to patient size Laryngoscope Size: Mac and 3 Grade View: Grade I Tube type: Oral Tube size: 8.0 mm Number of attempts: 1 Airway Equipment and Method: Stylet Placement Confirmation: ETT inserted through vocal cords under direct vision,  positive ETCO2 and breath sounds checked- equal and bilateral Secured at: 22 cm Tube secured with: Tape Dental Injury: Teeth and Oropharynx as per pre-operative assessment

## 2015-02-05 NOTE — Discharge Summary (Signed)
Physician Discharge Summary  Patient ID: Jared Vincent MRN: [id number] DOB/AGE: [birth year] 43 y.o.  Admit date: 02/04/2015 Discharge date: 02/05/2015  Admission Diagnoses: Acute appendicitis  Discharge Diagnoses: Same Active Problems:   Appendicitis   Acute appendicitis   Discharged Condition: good  Hospital Course: Patient is a 44 year old white male who presented emergency room with worsening right lower quadrant abdominal pain. He was found on CT scan of the abdomen to have acute appendicitis. He underwent laparoscopic appendectomy on 02/04/2015. Tolerated the procedure well. He is being discharged home on 02/05/2015 in good improving condition.  Treatments: surgery: Laparoscopic appendectomy on 02/04/2015  Discharge Exam: Blood pressure 131/81, pulse 71, temperature 98 F (36.7 C), temperature source Oral, resp. rate 20, height 6' (1.829 m), weight 104.826 kg (231 lb 1.6 oz), SpO2 99 %. General appearance: alert, cooperative and no distress Resp: clear to auscultation bilaterally Cardio: regular rate and rhythm, S1, S2 normal, no murmur, click, rub or gallop GI: Soft, dressings dry and intact.  Disposition: 01-Home or Self Care     Medication List    TAKE these medications        aspirin EC 81 MG tablet  Take 81 mg by mouth daily.     montelukast 10 MG tablet  Commonly known as:  SINGULAIR  Take 10 mg by mouth at bedtime.     multivitamin with minerals Tabs tablet  Take 1 tablet by mouth daily.     oxyCODONE-acetaminophen 7.5-325 MG per tablet  Commonly known as:  PERCOCET  Take 1-2 tablets by mouth every 4 (four) hours as needed.     ZOLOFT PO  Take 100 mg by mouth daily.     zolpidem 10 MG tablet  Commonly known as:  AMBIEN  Take 10 mg by mouth at bedtime as needed for sleep.           Follow-up Information    Follow up with Dalia HeadingJENKINS,Auri Jahnke A, MD. Schedule an appointment as soon as possible for a visit on 02/13/2015.   Specialty:  General Surgery   Contact information:   [address] [place] [id number] [phone]       Signed: Franky MachoJENKINS,[place]ck [place] A 02/05/2015, 9:12 AM

## 2015-02-05 NOTE — Addendum Note (Signed)
Addendum  created 02/05/15 0700 by Despina Hiddenobert J Gertude Benito, CRNA   Modules edited: Anesthesia Blocks and Procedures, Clinical Notes   Clinical Notes:  File: [id number]

## 2015-02-05 NOTE — Progress Notes (Signed)
Patient given d/c instructions and hard rx this shift prior to d/c home. Patient confirmed understanding of f/u appointment with Dr.Jenkins on 02/13/15. IV catheter removed from RIGHT AC, catheter tip intact, no s/s of infection noted, tolerated well. Patient's mother arrived to take patient home. Patient transferred to vehicle via w/c by this Clinical research associatewriter.

## 2015-02-05 NOTE — Discharge Instructions (Signed)
Laparoscopic Appendectomy °Care After °Refer to this sheet in the next few weeks. These instructions provide you with information on caring for yourself after your procedure. Your caregiver may also give you more specific instructions. Your treatment has been planned according to current medical practices, but problems sometimes occur. Call your caregiver if you have any problems or questions after your procedure. °HOME CARE INSTRUCTIONS °· Do not drive while taking narcotic pain medicines. °· Use stool softener if you become constipated from your pain medicines. °· Change your bandages (dressings) as directed. °· Keep your wounds clean and dry. You may wash the wounds gently with soap and water. Gently pat the wounds dry with a clean towel. °· Do not take baths, swim, or use hot tubs for 10 days, or as instructed by your caregiver. °· Only take over-the-counter or prescription medicines for pain, discomfort, or fever as directed by your caregiver. °· You may continue your normal diet as directed. °· Do not lift more than 10 pounds (4.5 kg) or play contact sports for 3 weeks, or as directed. °· Slowly increase your activity after surgery. °· Take deep breaths to avoid getting a lung infection (pneumonia). °SEEK MEDICAL CARE IF: °· You have redness, swelling, or increasing pain in your wounds. °· You have pus coming from your wounds. °· You have drainage from a wound that lasts longer than 1 day. °· You notice a bad smell coming from the wounds or dressing. °· Your wound edges break open after stitches (sutures) have been removed. °· You notice increasing pain in the shoulders (shoulder strap areas) or near your shoulder blades. °· You develop dizzy episodes or fainting while standing. °· You develop shortness of breath. °· You develop persistent nausea or vomiting. °· You cannot control your bowel functions or lose your appetite. °· You develop diarrhea. °SEEK IMMEDIATE MEDICAL CARE IF:  °· You have a fever. °· You  develop a rash. °· You have difficulty breathing or sharp pains in your chest. °· You develop any reaction or side effects to medicines given. °MAKE SURE YOU: °· Understand these instructions. °· Will watch your condition. °· Will get help right away if you are not doing well or get worse. °Document Released: 10/18/2005 Document Revised: 01/10/2012 Document Reviewed: 04/27/2011 °[employer]® Patient Information ©2015 [employer]. This information is not intended to replace advice given to you by your health care provider. Make sure you discuss any questions you have with your health care provider. ° °

## 2015-02-06 ENCOUNTER — Encounter (HOSPITAL_COMMUNITY): Payer: Self-pay | Admitting: General Surgery

## 2016-10-06 IMAGING — CT CT ABD-PELV W/ CM
2 of 5 series · 17 of 46 positions shown, 19 images · IV contrast (Omnipaque 300)
Comparison: CT scan of March 13, 2012.

CLINICAL DATA: Acute right lower quadrant pain for 3 days.

EXAM:
CT ABDOMEN AND PELVIS WITH CONTRAST
TECHNIQUE: Multidetector CT imaging of the abdomen and pelvis was performed
using the standard protocol following bolus administration of
intravenous contrast.
CONTRAST:  50mL OMNIPAQUE IOHEXOL 300 MG/ML SOLN, 100mL OMNIPAQUE
IOHEXOL 300 MG/ML SOLN

[Series 2: abd_pel_with 5.0 b40f · axial · 0.82mm/px · z∈[-508,-28]mm · 14 of 108 slices shown, 16 images]
[im 6/108  soft-tissue]
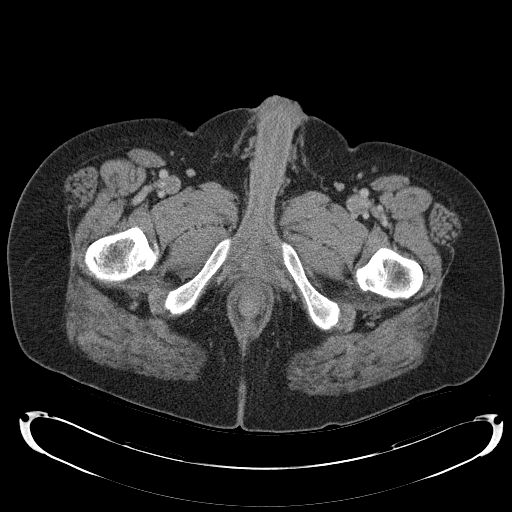
[im 6/108  bone]
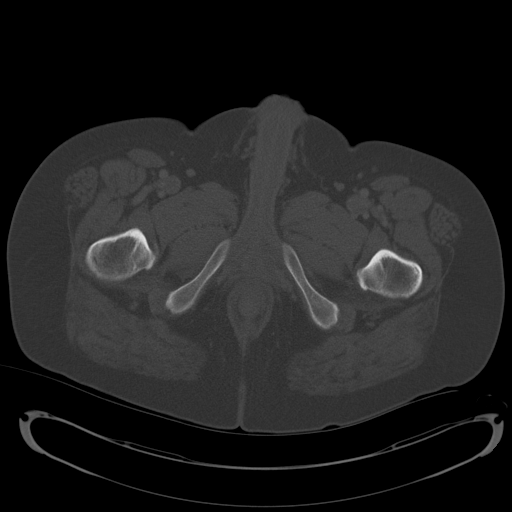
[im 12/108  soft-tissue]
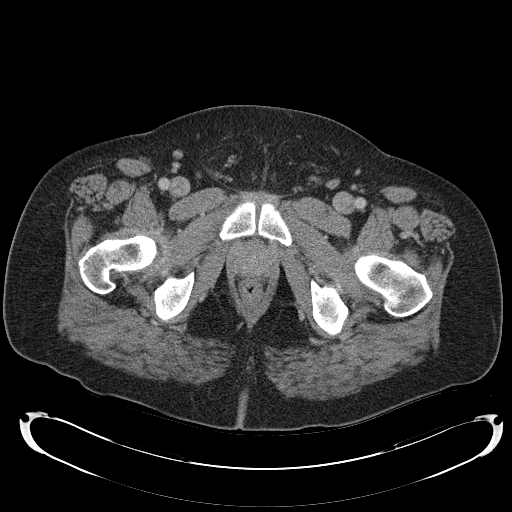
[im 24/108  soft-tissue]
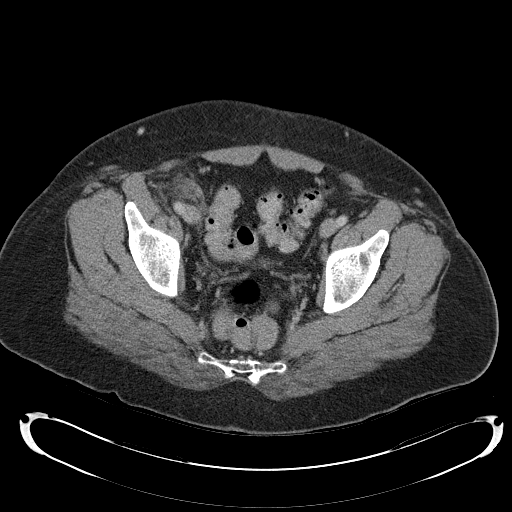
[im 30/108  soft-tissue]
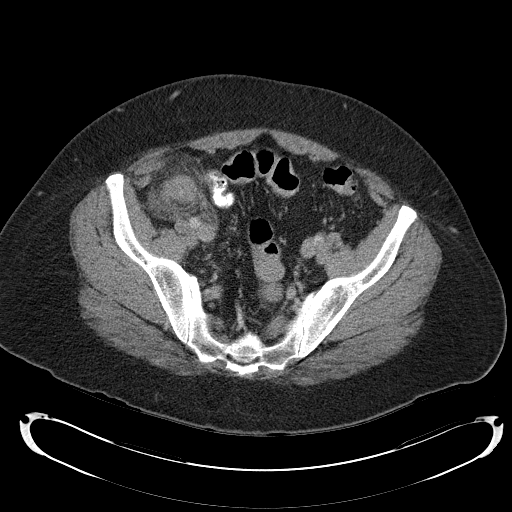
[im 36/108  soft-tissue]
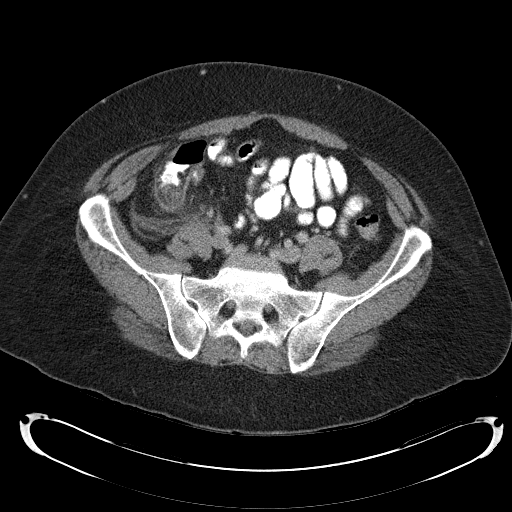
[im 42/108  soft-tissue]
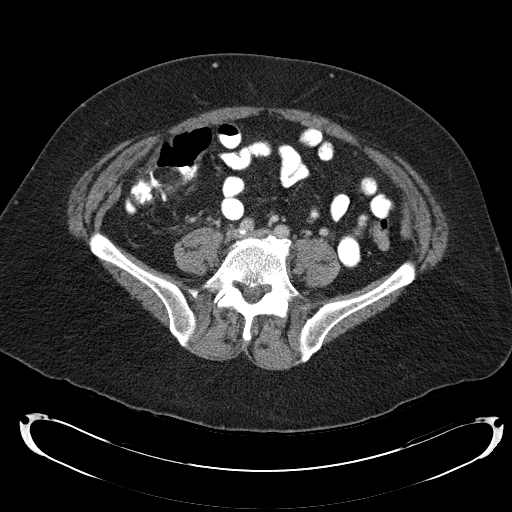
[im 48/108  soft-tissue]
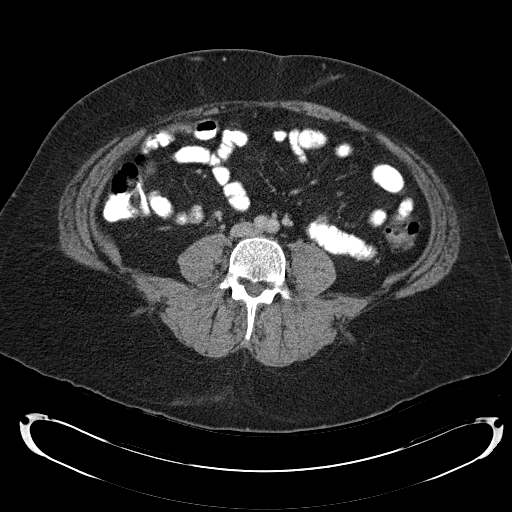
[im 60/108  soft-tissue]
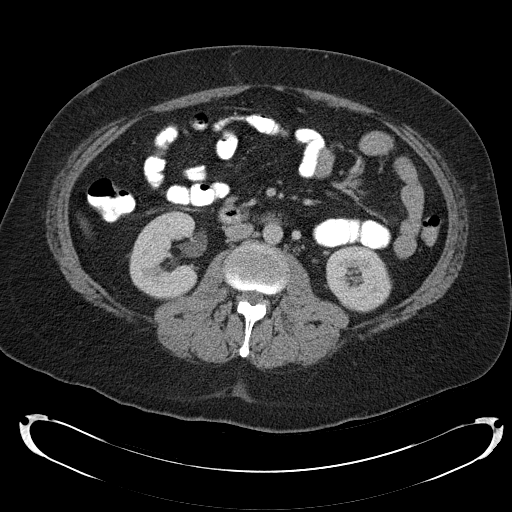
[im 66/108  soft-tissue]
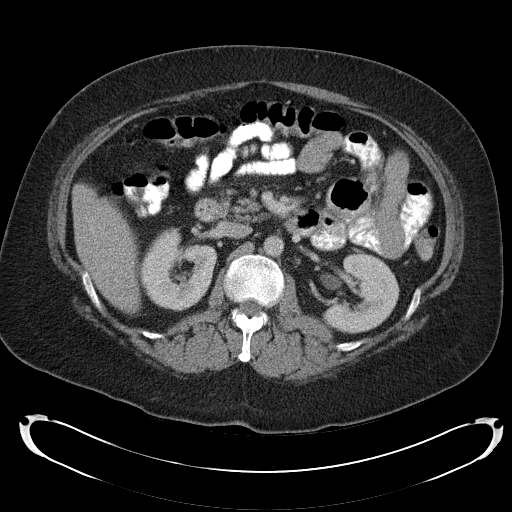
[im 66/108  bone]
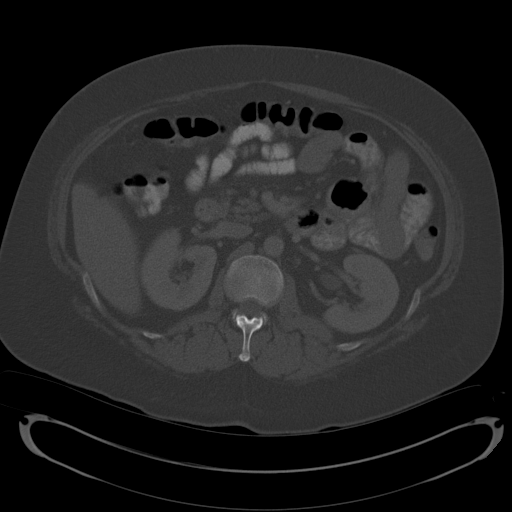
[im 72/108  soft-tissue]
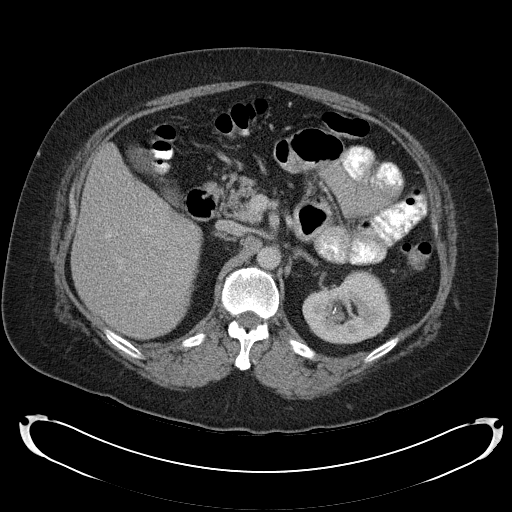
[im 78/108  soft-tissue]
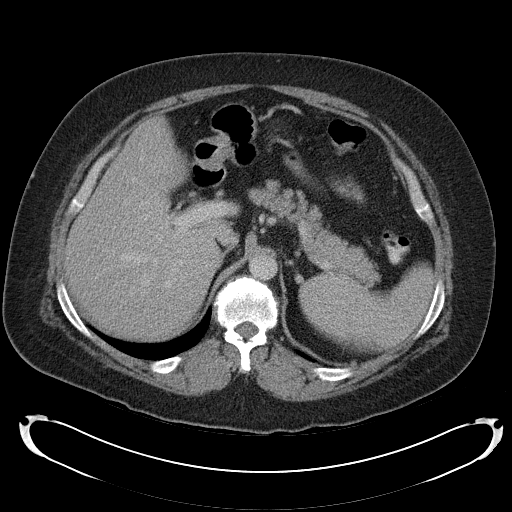
[im 84/108  soft-tissue]
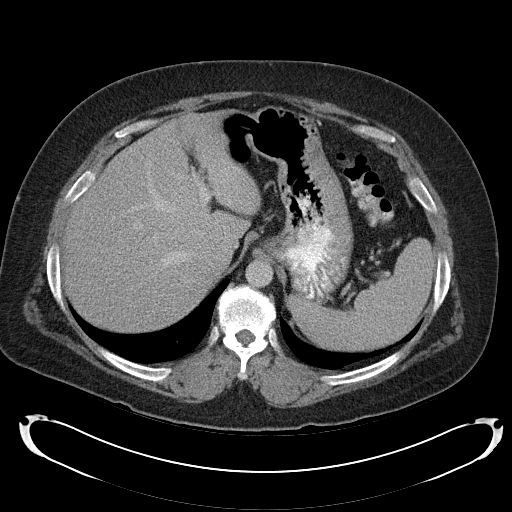
[im 96/108  soft-tissue]
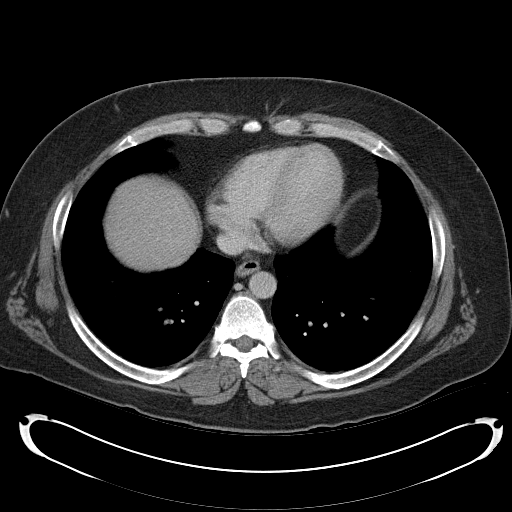
[im 102/108  soft-tissue]
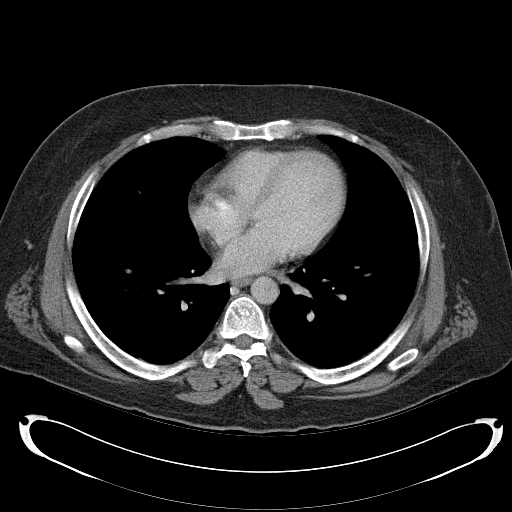

[Series 3: abd_pel_with 3.0 spo · coronal · 0.91mm/px · 3 of 91 slices shown]
[im 31/91  soft-tissue]
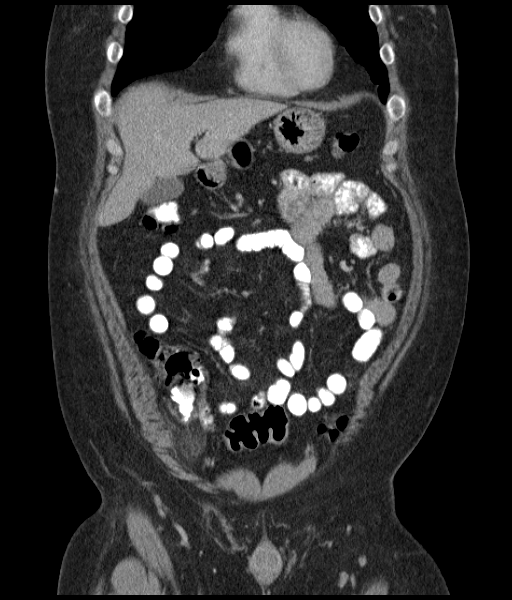
[im 41/91  soft-tissue]
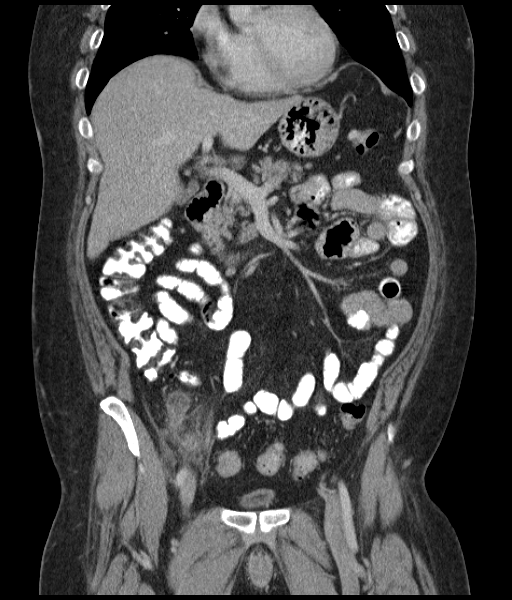
[im 51/91  soft-tissue]
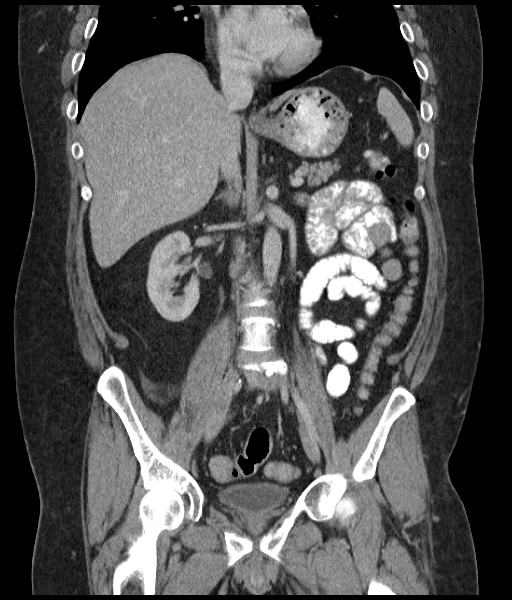

[17 of 46 positions shown; findings below may reference images not displayed]

FINDINGS: Mild degenerative disc disease is noted at L4-5. Visualized lung
bases appear normal.

No gallstones are noted. The liver, spleen and pancreas appear
normal. Adrenal glands and kidneys appear normal. No hydronephrosis
or renal obstruction is noted. Small nonobstructive calculus is
noted in upper pole collecting system of left kidney. No ureteral
calculi are noted. The appendix is enlarged with surrounding
inflammation consistent with appendicitis. No abnormal fluid
collection is noted. There is no evidence of bowel obstruction.
Urinary bladder appears normal. No significant adenopathy is noted.
IMPRESSION: Findings consistent with acute appendicitis.
